# Patient Record
Sex: Male | Born: 1985 | Race: White | Hispanic: No | Marital: Single | Smoking: Never smoker
Health system: Southern US, Community
[De-identification: ages and names within clinical notes are randomized; demographics above are authoritative.]

## PROBLEM LIST (undated history)

## (undated) DIAGNOSIS — F419 Anxiety disorder, unspecified: Secondary | ICD-10-CM

## (undated) DIAGNOSIS — I10 Essential (primary) hypertension: Secondary | ICD-10-CM

---
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
## 2007-09-22 ENCOUNTER — Encounter: Admission: RE | Admit: 2007-09-22 | Discharge: 2007-09-22 | Payer: Self-pay | Admitting: Family Medicine

## 2008-06-10 IMAGING — CT CT PELVIS W/ CM
2 of 5 series · 17 of 46 positions shown, 19 images · IV contrast (RADICAT/WATER & [ID] OMNI 300)
Comparison: None.

CLINICAL DATA: Right lower quadrant pain. 
ABDOMEN CT WITH CONTRAST:
TECHNIQUE: Multidetector CT imaging of the abdomen was performed following the standard protocol during bolus administration of intravenous contrast.
Contrast:  355cc Omnipaque 300
TECHNIQUE: Multidetector CT imaging of the pelvis was performed following the standard protocol during bolus administration of intravenous contrast.

[Series 2: abdomen w/ · axial · 0.70mm/px · z∈[-486,-61]mm · 14 of 95 slices shown, 16 images]
[im 5/95  soft-tissue]
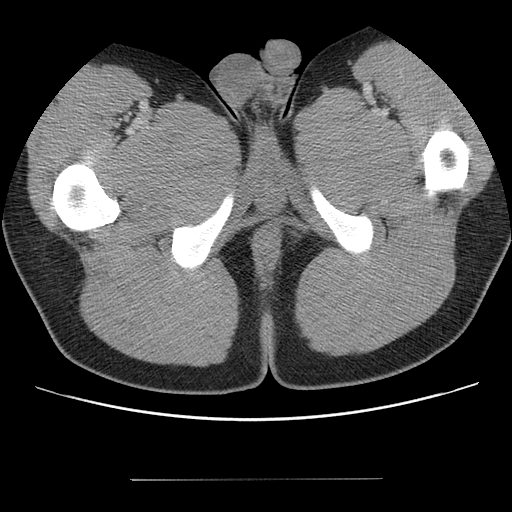
[im 5/95  bone]
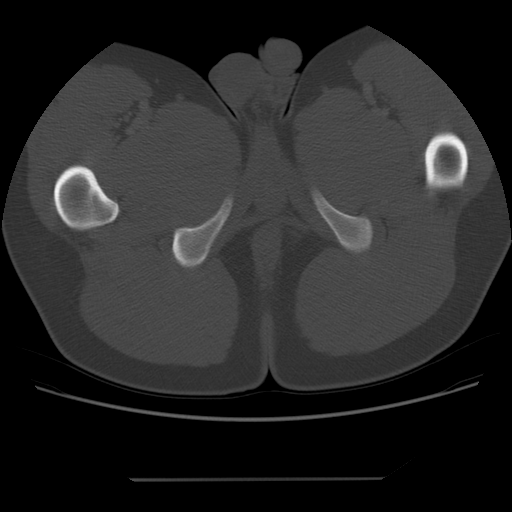
[im 10/95  soft-tissue]
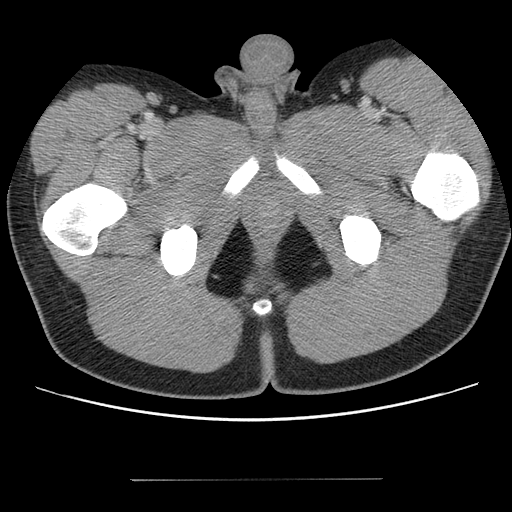
[im 20/95  soft-tissue]
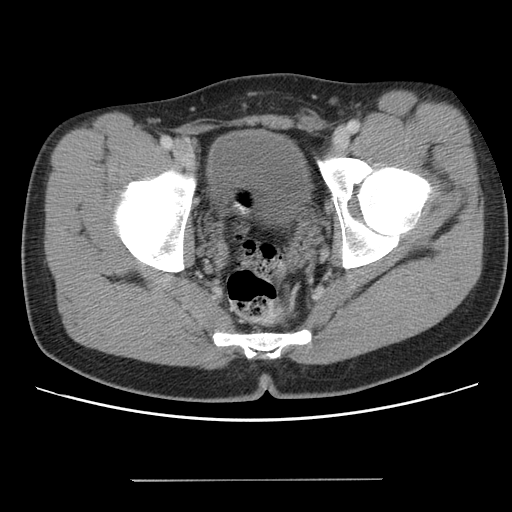
[im 25/95  soft-tissue]
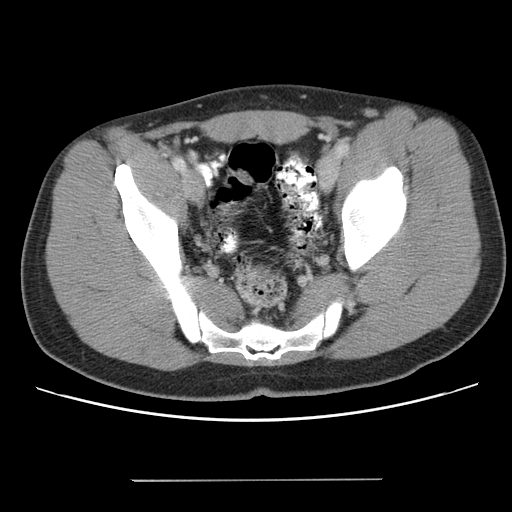
[im 30/95  soft-tissue]
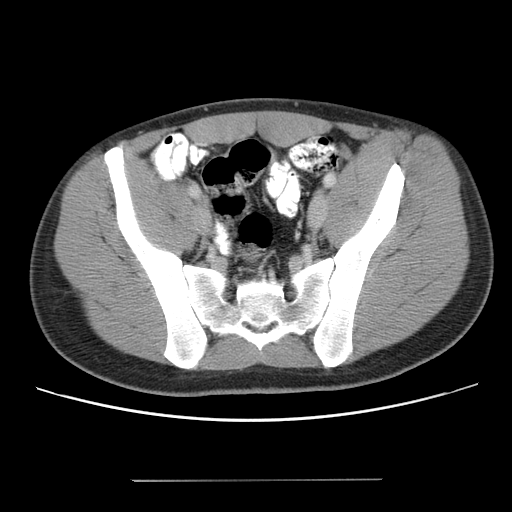
[im 40/95  soft-tissue]
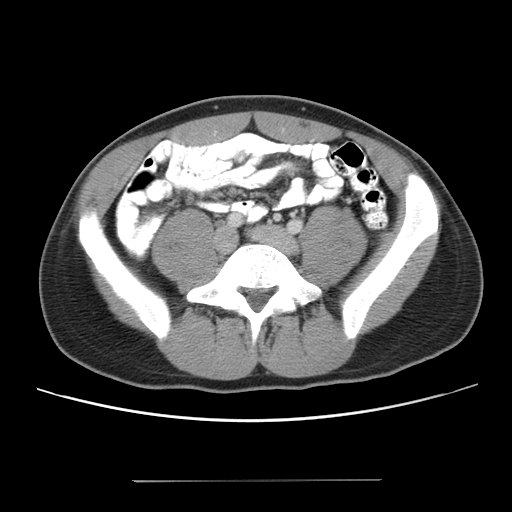
[im 45/95  soft-tissue]
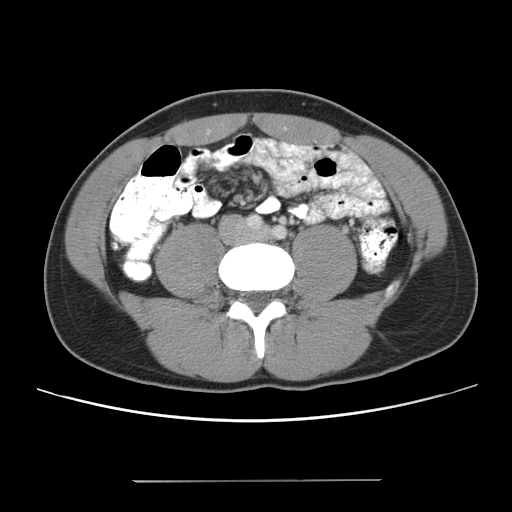
[im 50/95  soft-tissue]
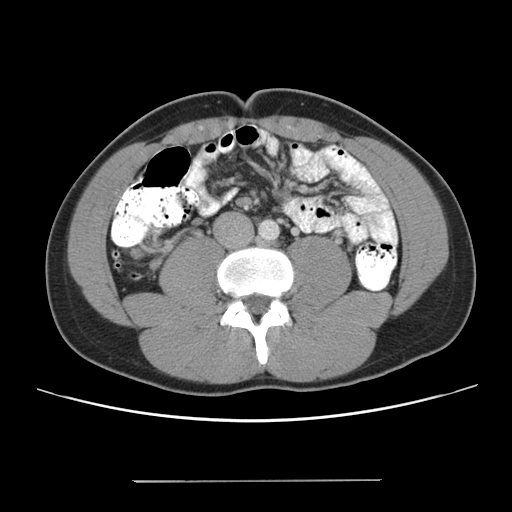
[im 55/95  soft-tissue]
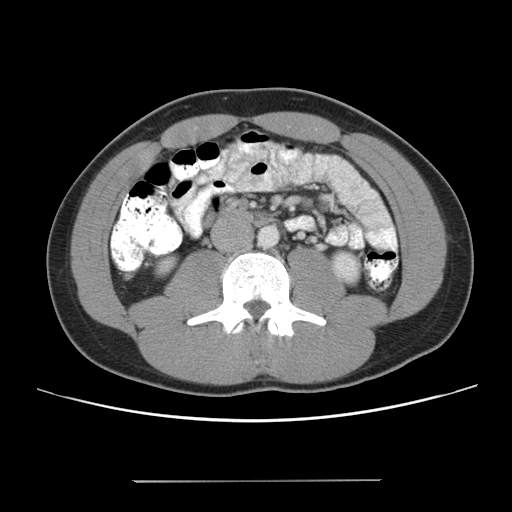
[im 55/95  bone]
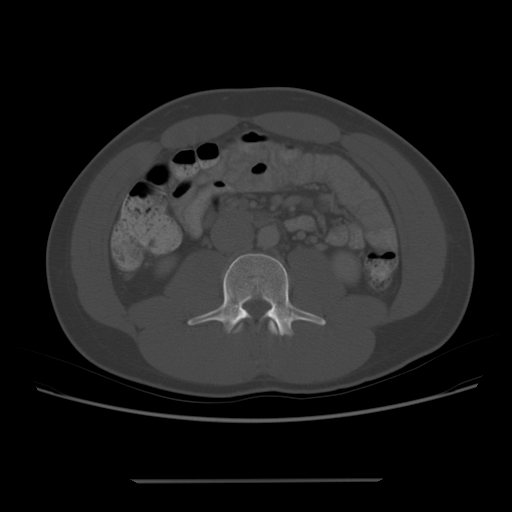
[im 65/95  soft-tissue]
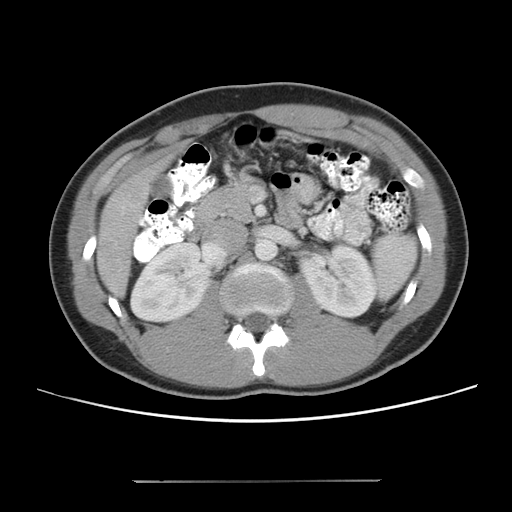
[im 70/95  soft-tissue]
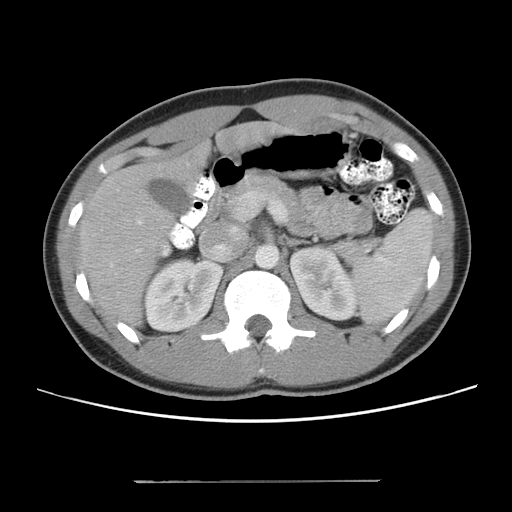
[im 75/95  soft-tissue]
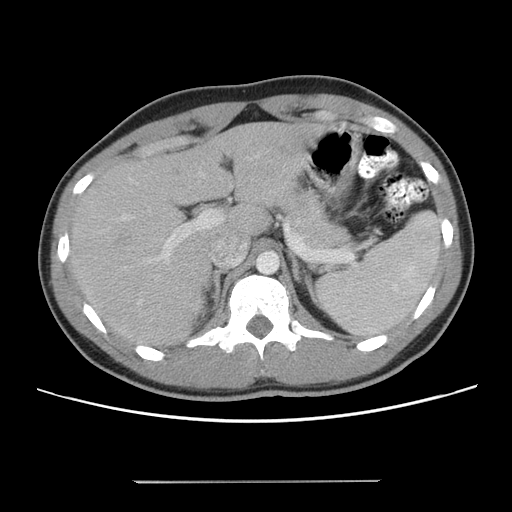
[im 85/95  soft-tissue]
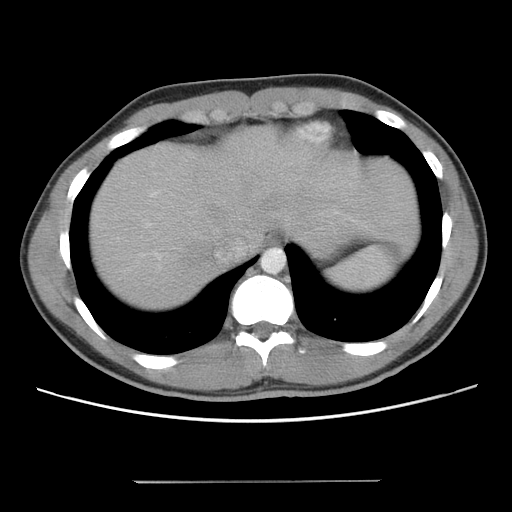
[im 90/95  soft-tissue]
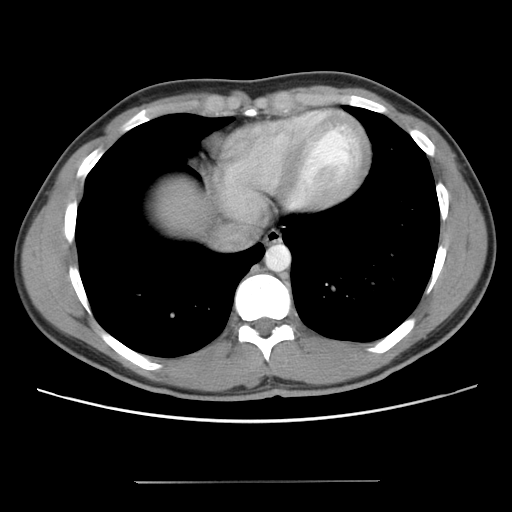

[Series 400: cor · coronal · 0.86mm/px · 3 of 97 slices shown]
[im 33/97  soft-tissue]
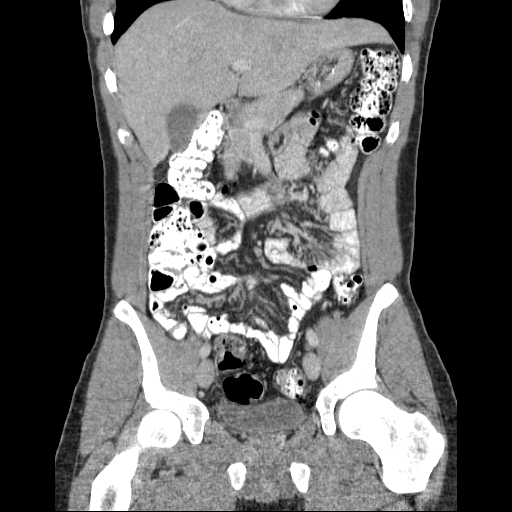
[im 43/97  soft-tissue]
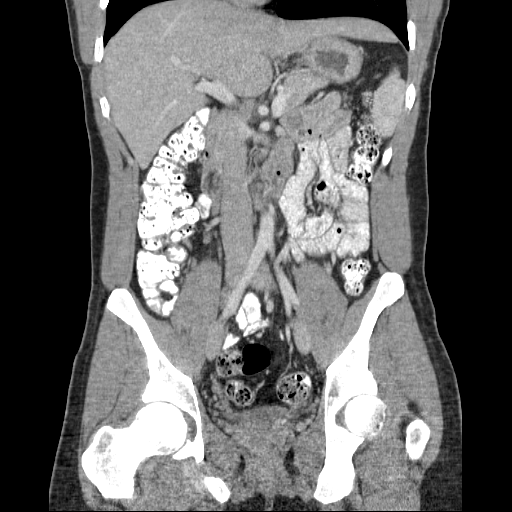
[im 54/97  soft-tissue]
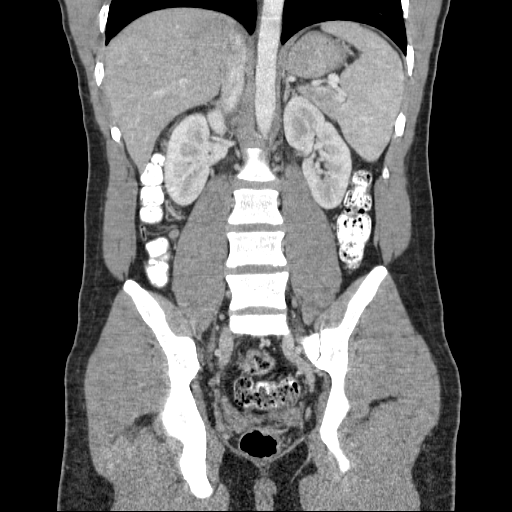

[17 of 46 positions shown; findings below may reference images not displayed]

FINDINGS: The lung bases are clear.  
The liver is normal in attenuation and morphology.  There is a water density mass at the left cardiophrenic angle measuring 15.7 x 51.4 mm, image 9. 
Spleen negative.
Adrenal glands negative.
The pancreas is negative.
The left kidney is negative.
The right kidney is negative.
Negative for enlarged retroperitoneal or small bowel mesenteric lymph nodes.
There is no free fluid, or abnormal fluid collections.
The bowel loops are nondilated.  The appendix is normal.
IMPRESSION: 1.  No acute upper abdominal CT findings. 
2.  Normal appendix. 
PELVIS CT WITH CONTRAST:
FINDINGS: The urinary bladder is normal. 
There is no free fluid or abnormal fluid collections. 
The pelvic bowel loops are unremarkable.  
There is no mass.  
No enlarged pelvic or inguinal lymph nodes.
IMPRESSION: No acute pelvic CT findings.

## 2011-05-25 ENCOUNTER — Emergency Department (HOSPITAL_COMMUNITY)
Admission: EM | Admit: 2011-05-25 | Discharge: 2011-05-26 | Disposition: A | Discharge status: Other Acute Inpatient Hospital | Payer: 59 | Attending: Emergency Medicine | Admitting: Emergency Medicine

## 2011-05-25 DIAGNOSIS — F4310 Post-traumatic stress disorder, unspecified: Secondary | ICD-10-CM | POA: Insufficient documentation

## 2011-05-25 DIAGNOSIS — F29 Unspecified psychosis not due to a substance or known physiological condition: Secondary | ICD-10-CM | POA: Insufficient documentation

## 2011-05-25 DIAGNOSIS — F988 Other specified behavioral and emotional disorders with onset usually occurring in childhood and adolescence: Secondary | ICD-10-CM | POA: Insufficient documentation

## 2011-05-25 LAB — CBC
Hemoglobin: 15.5 g/dL (ref 13.0–17.0)
MCH: 31.7 pg (ref 26.0–34.0)
Platelets: 206 10*3/uL (ref 150–400)
RBC: 4.89 MIL/uL (ref 4.22–5.81)

## 2011-05-25 LAB — DIFFERENTIAL
Basophils Absolute: 0 10*3/uL (ref 0.0–0.1)
Basophils Relative: 0 % (ref 0–1)
Eosinophils Absolute: 0 10*3/uL (ref 0.0–0.7)
Monocytes Relative: 4 % (ref 3–12)
Neutro Abs: 7.5 10*3/uL (ref 1.7–7.7)
Neutrophils Relative %: 84 % — ABNORMAL HIGH (ref 43–77)

## 2011-05-26 ENCOUNTER — Emergency Department (HOSPITAL_COMMUNITY): Payer: 59

## 2011-05-26 ENCOUNTER — Encounter (HOSPITAL_COMMUNITY): Payer: Self-pay

## 2011-05-26 ENCOUNTER — Inpatient Hospital Stay (HOSPITAL_COMMUNITY)
Admission: AD | Admit: 2011-05-26 | Discharge: 2011-06-03 | DRG: 897 | Disposition: A | Discharge status: Home / Self Care | Payer: 59 | Source: Ambulatory Visit | Attending: Psychiatry | Admitting: Psychiatry

## 2011-05-26 DIAGNOSIS — F29 Unspecified psychosis not due to a substance or known physiological condition: Secondary | ICD-10-CM

## 2011-05-26 DIAGNOSIS — F1920 Other psychoactive substance dependence, uncomplicated: Principal | ICD-10-CM

## 2011-05-26 DIAGNOSIS — Z79899 Other long term (current) drug therapy: Secondary | ICD-10-CM

## 2011-05-26 DIAGNOSIS — Z560 Unemployment, unspecified: Secondary | ICD-10-CM

## 2011-05-26 DIAGNOSIS — F1010 Alcohol abuse, uncomplicated: Secondary | ICD-10-CM

## 2011-05-26 LAB — RAPID URINE DRUG SCREEN, HOSP PERFORMED
Barbiturates: NOT DETECTED
Benzodiazepines: NOT DETECTED
Cocaine: NOT DETECTED
Tetrahydrocannabinol: NOT DETECTED

## 2011-05-26 LAB — COMPREHENSIVE METABOLIC PANEL
ALT: 14 U/L (ref 0–53)
AST: 19 U/L (ref 0–37)
Alkaline Phosphatase: 53 U/L (ref 39–117)
CO2: 25 mEq/L (ref 19–32)
Calcium: 9.8 mg/dL (ref 8.4–10.5)
GFR calc non Af Amer: >90 mL/min (ref >90)
Glucose, Bld: 117 mg/dL — ABNORMAL HIGH (ref 70–99)
Potassium: 3.7 mEq/L (ref 3.5–5.1)
Sodium: 138 mEq/L (ref 135–145)
Total Protein: 7.3 g/dL (ref 6.0–8.3)

## 2011-05-28 NOTE — Assessment & Plan Note (Signed)
NAMEMARLOWE, LAWES NO.:  [id number]  MEDICAL RECORD NO.:  [id number]  LOCATION:  0403                          FACILITY:  [facility]  PHYSICIAN:  Eulogio Ditch, MD DATE OF BIRTH:  11/11/1985  DATE OF ADMISSION:  05/26/2011 DATE OF DISCHARGE:                      PSYCHIATRIC ADMISSION ASSESSMENT   IDENTIFYING INFORMATION:  A 26 year old male that was admitted on May 26, 2011.  REASON FOR ADMISSION:  The patient presented to the emergency department with complaints of anxiety.  He states that he has been using a lot of Adderall.  He states that he has a history of binging alcohol, seeing visions.  He denies any suicidal or homicidal thoughts.  Denies any auditory hallucinations.  Feeling somewhat confused.  PAST PSYCHIATRIC HISTORY:  The patient reports being hospitalized at [facility] in the past for depression and alcohol.  SOCIAL HISTORY:  The patient is single.  He lives alone, is unemployed.  FAMILY HISTORY:  None.  ALCOHOL AND DRUG HISTORY:  Again, the patient reports overusing his Adderall and has a past history of alcohol use.  PRIMARY CARE PROVIDER:  None.  MEDICAL PROBLEMS:  The patient denies any acute or chronic health issues.  MEDICATIONS:  Again, list Adderall at 20 mg b.i.d.  DRUG ALLERGIES:  No known allergies.  PHYSICAL EXAM:  This is a well-nourished, well-developed young male assessed in the emergency department.  Physical exam was reviewed.  No significant findings.  He appears in no distress.  He offers no physical complaints.  LABORATORY DATA:  Shows a CAT scan of his head that was unremarkable. Urine drug screen is negative.  Alcohol level less than 11.  Glucose at 117.  CBC is within normal limits.  MENTAL STATUS EXAM:  The patient was seen with Dr. Rogers Blocker.  The patient was in group.  When the patient came out he seemed very guarded and states that he did not want to talk.  He felt that he was singled out of the  group.  He again presented somewhat guarded but then was willing to answer questions.  He had little eye contact again and was dressed in hospital scrubs.  His speech is clear.  His thought processes are somewhat scattered.  He reports feeling that he is having "epiphanies".  He is aware of himself and place.  He does seem to get confused about date.  DIAGNOSES:  AXIS I:  Polysubstance dependence.  Psychosis not otherwise specified. AXIS II:  Deferred. AXIS III:  No known medical conditions. AXIS IV:  Deferred at this time. AXIS V:  Current is 35.  PLAN:  To initiate Risperdal.  Will address his substance use.  We briefly discussed rehab.  The patient is unclear as to what he wants to pursue after hospitalization.  Will continue with the trazodone as the patient states he slept well.  His tentative length of stay at this time is 4-6 days.     Landry Corporal, N.P.   ______________________________ Eulogio Ditch, MD    JO/MEDQ  D:  05/27/2011  T:  05/27/2011  Job:  [id number]  Electronically Signed by Limmie PatriciaP. on 05/28/2011 10:32:06 AM Electronically Signed by Eulogio Ditch  on 05/28/2011 02:31:56 PM

## 2011-06-19 NOTE — Discharge Summary (Cosign Needed)
NAMEMUKUND, WEINREB NO.:  [id number]  MEDICAL RECORD NO.:  [id number]  LOCATION:  0403                          FACILITY:  [facility]  PHYSICIAN:  Eulogio Ditch, MD DATE OF BIRTH:  1985/08/15  DATE OF ADMISSION:  05/26/2011 DATE OF DISCHARGE:  06/03/2011                              DISCHARGE SUMMARY   IDENTIFYING INFORMATION:  This is a 25 year old male.  This is a voluntary admission.  HISTORY OF PRESENT ILLNESS:  Derrick Blake was admitted by way of our emergency room with complaints of anxiety.  He also admitted that he had been bingeing on alcohol.  He was referred for altered mental status.  On initial presentation, he appeared guarded with little eye contact and finally verbally expressed that he did not want to talk.  Thought processes were rather distracted, scattered.  He made a statement that he was having "epiphanies".  He was oriented to person and place, and was referred for ongoing stabilization.  He had a past history of being hospitalized for substance abuse and depression.  Currently lives alone. He also admitted to over using the Adderall that is prescribed for attention deficit disorder.  MEDICAL EVALUATION:  He was medically evaluated in our emergency room and received additional physical and medical evaluation here on the unit which was found to be consistent with the emergency room.  He offered no physical complaints.  PAST MEDICAL HISTORY:  Attention deficit and hyperactivity disorder.  LAB STUDIES:  Done in the emergency room included a CT scan of his brain that was negative for any acute findings.  Urine drug screen was negative for all substances.  Alcohol screen less than 11.  Random glucose 117, and a normal CBC.  COURSE OF HOSPITALIZATION:  He was admitted to our dual diagnosis unit and given a provisional diagnosis of polysubstance dependence and psychosis NOS.  He was initially started on Ativan 1 mg q.6 hours for anxiety,  Risperdal 0.5 mg M-Tab q.6 hours p.r.n. for psychosis or agitation, Benadryl 50 mg q.6 hours p.o. or IM p.r.n. for any signs of EPS, and trazodone 100 mg p.o. q.h.s. We also started him on a standing scheduled dose of Risperdal 0.5 mg q.a.m. and q.h.s.  Derrick Blake reported that he had been previously in [facility] in [place] in 2010 for not taking his Adderall., and admitted that he had a history of previous mental illness.  By the 24th he was slightly less paranoid and a little bit less disorganized, and we titrated his Risperdal to 1 mg p.o. q.h.s.  He was given Haldol 5 mg emergently on the evening of the 23rd for agitation.  He gave [place] permission to speak with his mother who contributed to his history.  She reported a history of unusual behaviors including preoccupations with the book of Revelations and attempting to interpret signs and symbols.  He had previously been discharged from the [facility] with an honorable discharge, but had a lot of difficulty being there.  Having panic attacks and would go 2 to 3 nights without sleeping.  While also in the [facility] he began having auditory hallucinations, and sensations that his whole body was  on fire.  It was at this time that he had been sent to [facility] in [place].  At that time, he was placed on unknown medications which he took for about 3 months and then subsequently stopped them.  His mother expressed her support and interest in having him move in with them for safety.  By the 26th he was more calm, articulate, cooperative, making statements that he believed that the antichrist was coming on October 31st, which also happened to be his birthday.  Clonazepam 0.5 mg b.i.d. was ordered to address his anxiety.  By the 29th he was complaining of some a.m. sedation and is trazodone was discontinued.  He continued to tolerate the Risperdal well without signs of EPS.  AIMS score was 0.  By the 30th he was  stable, calm, cooperative and ready for discharge.  DISCHARGE PLAN:  Follow up with Dr. Aquilla Solian [place] on [place] 30th at 12 noon, and Dr. Bayard Males, his psychiatrist, in [place], also on October 31st.  DISCHARGE DIAGNOSES:  AXIS I:  Psychosis, not otherwise specified. AXIS II:  Deferred. AXIS III:  No diagnosis. AXIS IV:  Deferred. AXIS V:  Current 55, past year 65 estimated.  DISCHARGE MEDICATIONS: 1. Benadryl 50 mg at q.h.s. 2. Risperdal 0.5 mg q.a.m. 3. Risperdal 2 mg p.o. q.h.s.     Derrick Blake, N.P.   ______________________________ Eulogio Ditch, MD    MAS/MEDQ  D:  06/19/2011  T:  06/19/2011  Job:  [id number]

## 2012-02-12 IMAGING — CT CT HEAD W/O CM
2 series · 15 of 30 positions shown, 19 images · non-contrast
Comparison: None.

CLINICAL DATA: Anxiety and psychosis; insomnia.  Medical clearance.

CT HEAD WITHOUT CONTRAST
TECHNIQUE: Contiguous axial images were obtained from the base of
the skull through the vertex without contrast.

[Series 2: head w/o · axial · non-contrast · 0.44mm/px · z∈[-131,-6]mm · 13 of 31 slices shown, 17 images]
[im 3/31  brain]
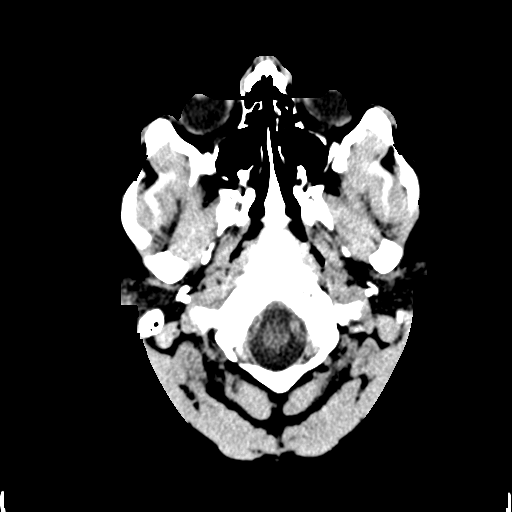
[im 3/31  bone]
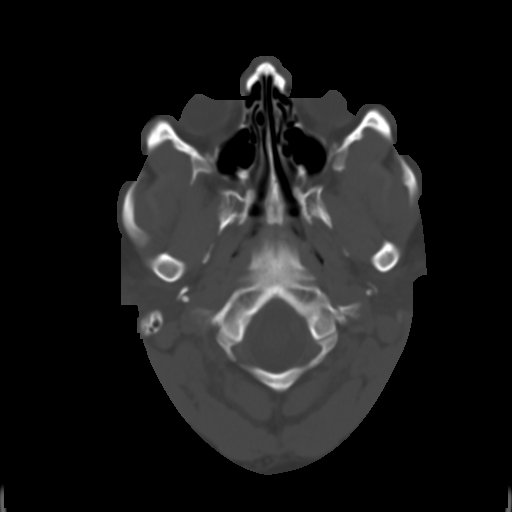
[im 5/31  brain]
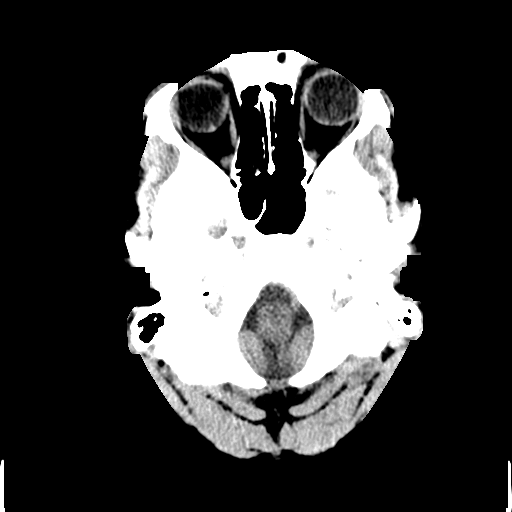
[im 7/31  brain]
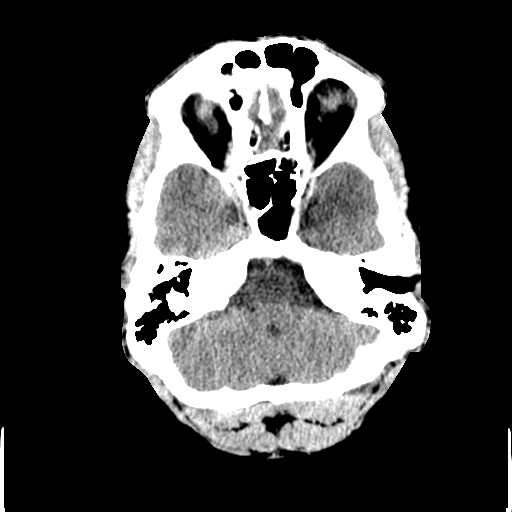
[im 9/31  brain]
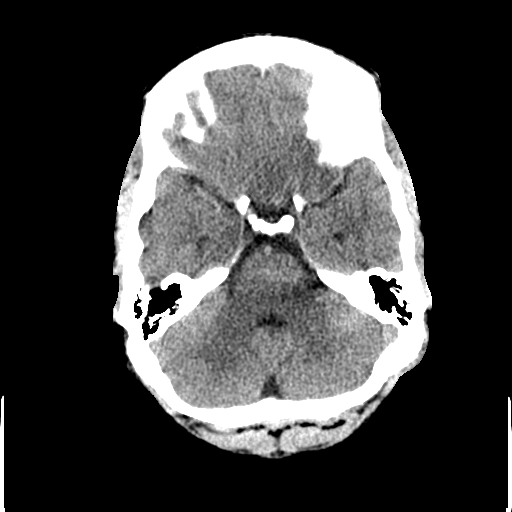
[im 11/31  brain]
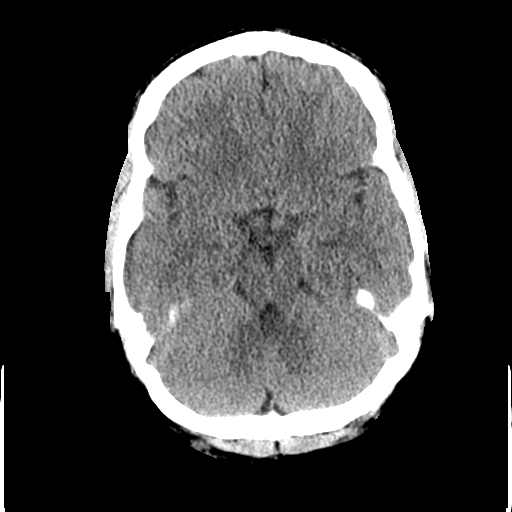
[im 11/31  bone]
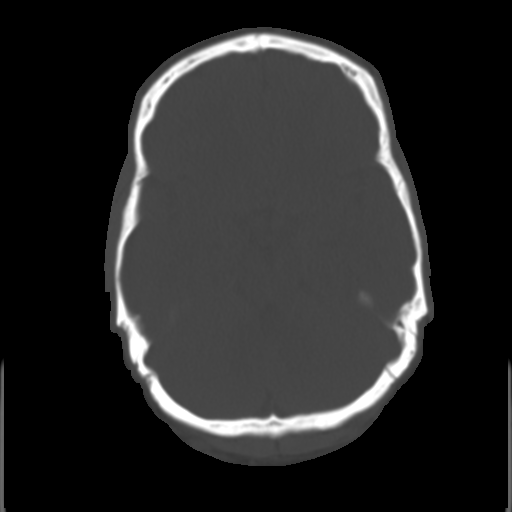
[im 13/31  brain]
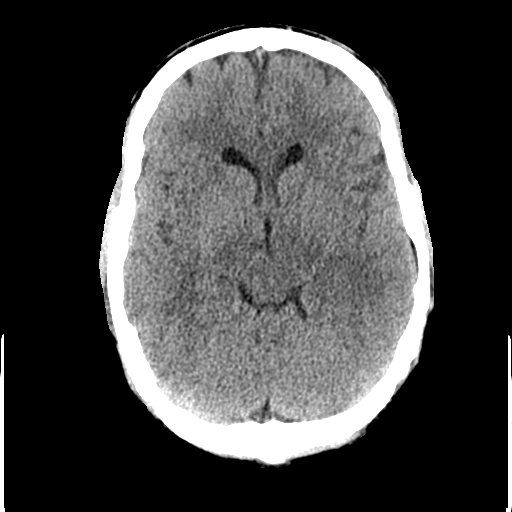
[im 16/31  brain]
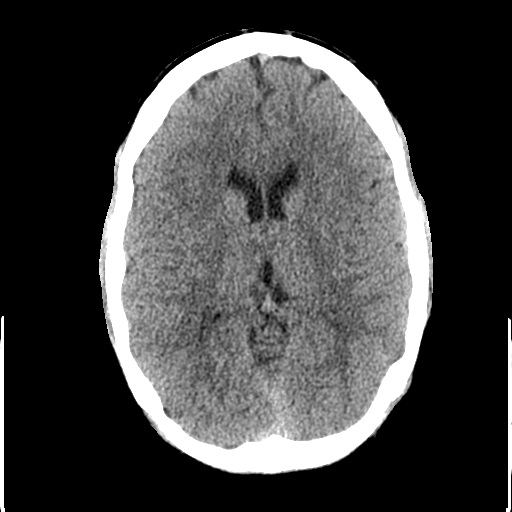
[im 18/31  brain]
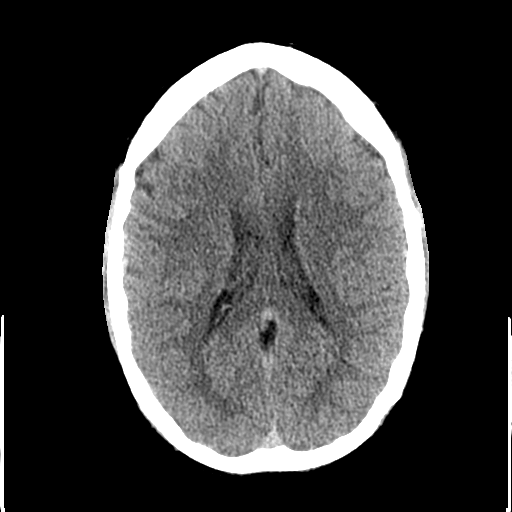
[im 20/31  brain]
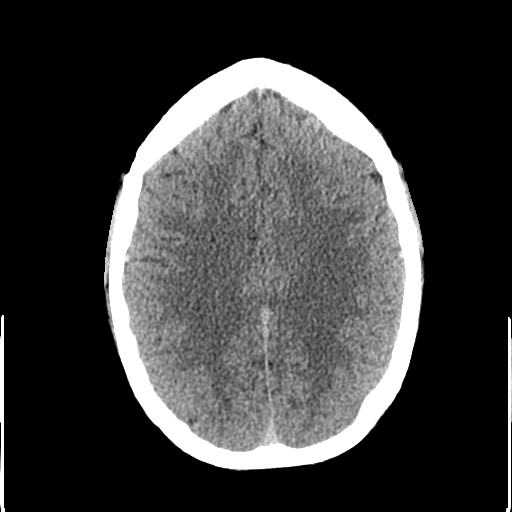
[im 20/31  bone]
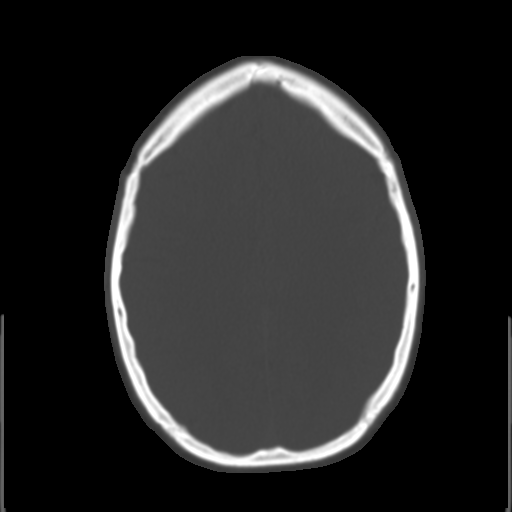
[im 22/31  brain]
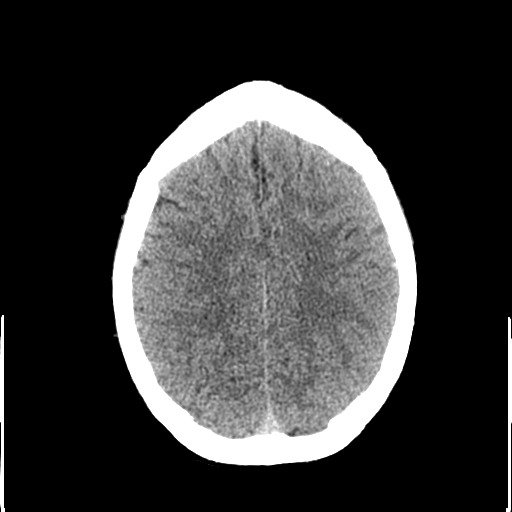
[im 24/31  brain]
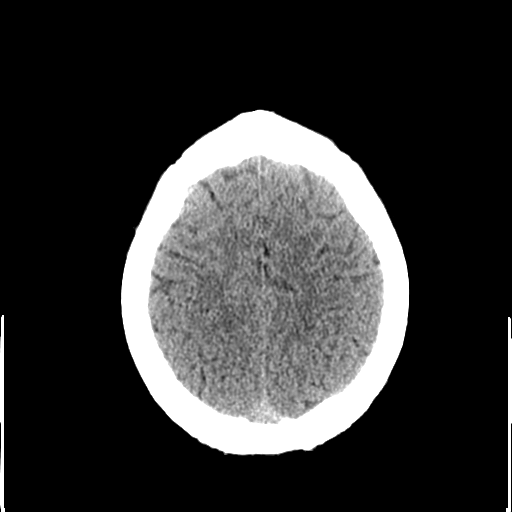
[im 26/31  brain]
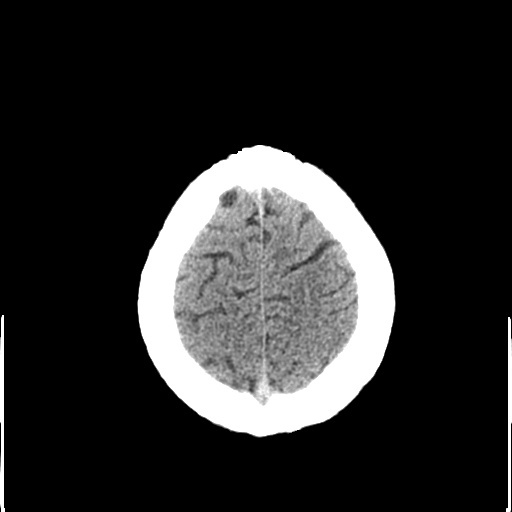
[im 28/31  brain]
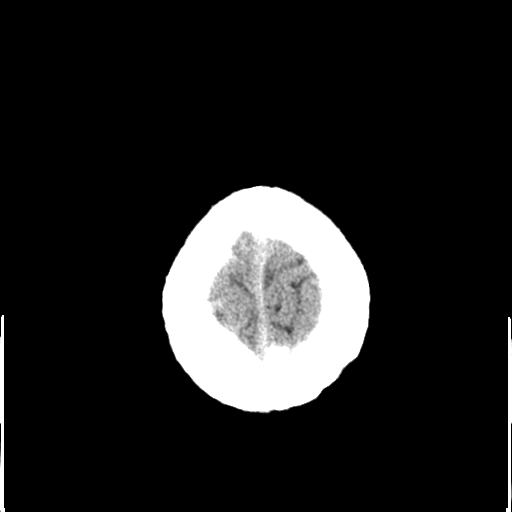
[im 28/31  bone]
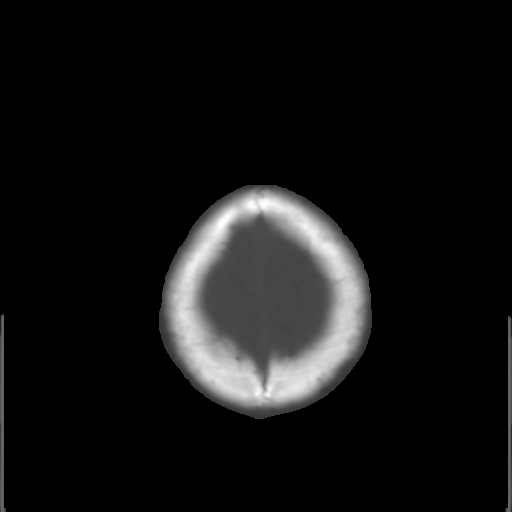

[Series 3: bone windows · axial · 0.44mm/px · z∈[-131,-111]mm · 2 of 31 slices shown]
[im 3/31  bone]
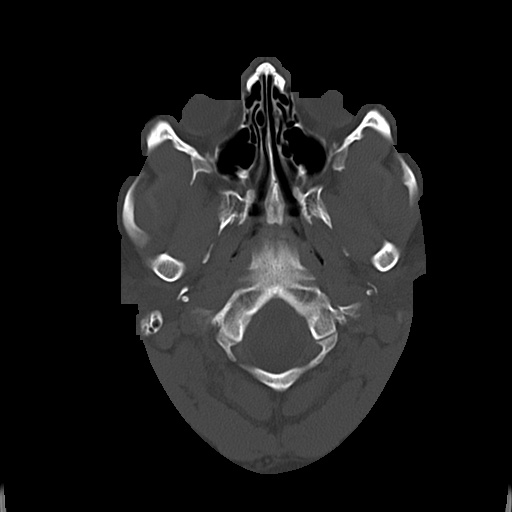
[im 7/31  bone]
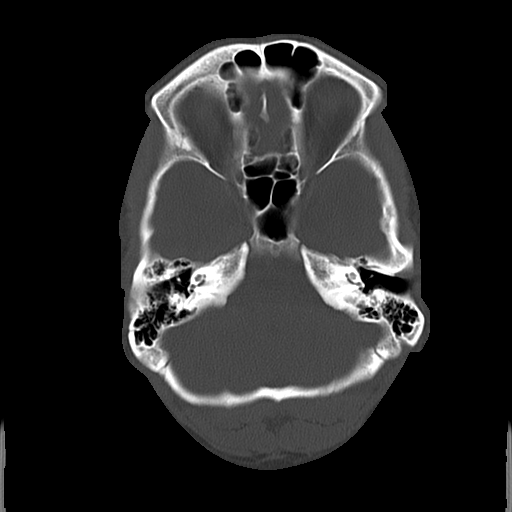

[15 of 30 positions shown; findings below may reference images not displayed]

FINDINGS: There is no evidence of acute infarction, mass lesion, or
intra- or extra-axial hemorrhage on CT.

The posterior fossa, including the cerebellum, brainstem and fourth
ventricle, is within normal limits.  The third and lateral
ventricles, and basal ganglia are unremarkable in appearance.  The
cerebral hemispheres are symmetric in appearance, with normal gray-
white differentiation.  No mass effect or midline shift is seen.

There is no evidence of fracture; visualized osseous structures are
unremarkable in appearance.  There is incomplete fusion of the
frontal suture.  The visualized portions of the orbits are within
normal limits.  The paranasal sinuses and mastoid air cells are
well-aerated.  No significant soft tissue abnormalities are seen.
IMPRESSION: Unremarkable noncontrast CT of the head.

## 2013-11-09 ENCOUNTER — Encounter (HOSPITAL_COMMUNITY): Payer: Self-pay | Admitting: Emergency Medicine

## 2013-11-09 ENCOUNTER — Emergency Department (HOSPITAL_COMMUNITY)
Admission: EM | Admit: 2013-11-09 | Discharge: 2013-11-10 | Disposition: A | Discharge status: Home / Self Care | Payer: BC Managed Care – PPO | Attending: Emergency Medicine | Admitting: Emergency Medicine

## 2013-11-09 DIAGNOSIS — IMO0002 Reserved for concepts with insufficient information to code with codable children: Secondary | ICD-10-CM | POA: Insufficient documentation

## 2013-11-09 DIAGNOSIS — I10 Essential (primary) hypertension: Secondary | ICD-10-CM | POA: Insufficient documentation

## 2013-11-09 DIAGNOSIS — F29 Unspecified psychosis not due to a substance or known physiological condition: Secondary | ICD-10-CM | POA: Insufficient documentation

## 2013-11-09 DIAGNOSIS — Z79899 Other long term (current) drug therapy: Secondary | ICD-10-CM | POA: Insufficient documentation

## 2013-11-09 DIAGNOSIS — F988 Other specified behavioral and emotional disorders with onset usually occurring in childhood and adolescence: Secondary | ICD-10-CM

## 2013-11-09 DIAGNOSIS — R197 Diarrhea, unspecified: Secondary | ICD-10-CM | POA: Insufficient documentation

## 2013-11-09 DIAGNOSIS — F419 Anxiety disorder, unspecified: Secondary | ICD-10-CM

## 2013-11-09 HISTORY — DX: Anxiety disorder, unspecified: F41.9

## 2013-11-09 HISTORY — DX: Essential (primary) hypertension: I10

## 2013-11-09 LAB — CBC WITH DIFFERENTIAL/PLATELET
Basophils Absolute: 0 10*3/uL (ref 0.0–0.1)
Basophils Relative: 0 % (ref 0–1)
EOS ABS: 0 10*3/uL (ref 0.0–0.7)
EOS PCT: 1 % (ref 0–5)
HEMATOCRIT: 43.6 % (ref 39.0–52.0)
HEMOGLOBIN: 15.4 g/dL (ref 13.0–17.0)
LYMPHS ABS: 1.7 10*3/uL (ref 0.7–4.0)
Lymphocytes Relative: 26 % (ref 12–46)
MCH: 31 pg (ref 26.0–34.0)
MCHC: 35.3 g/dL (ref 30.0–36.0)
MCV: 87.9 fL (ref 78.0–100.0)
MONO ABS: 0.4 10*3/uL (ref 0.1–1.0)
MONOS PCT: 6 % (ref 3–12)
Neutro Abs: 4.4 10*3/uL (ref 1.7–7.7)
Neutrophils Relative %: 67 % (ref 43–77)
PLATELETS: 185 10*3/uL (ref 150–400)
RBC: 4.96 MIL/uL (ref 4.22–5.81)
RDW: 12.1 % (ref 11.5–15.5)
WBC: 6.6 10*3/uL (ref 4.0–10.5)

## 2013-11-09 LAB — COMPREHENSIVE METABOLIC PANEL
ALT: 20 U/L (ref 0–53)
AST: 18 U/L (ref 0–37)
Albumin: 4.6 g/dL (ref 3.5–5.2)
Alkaline Phosphatase: 50 U/L (ref 39–117)
BUN: 17 mg/dL (ref 6–23)
CALCIUM: 9.8 mg/dL (ref 8.4–10.5)
CO2: 27 mEq/L (ref 19–32)
Chloride: 102 mEq/L (ref 96–112)
Creatinine, Ser: 0.93 mg/dL (ref 0.50–1.35)
GLUCOSE: 97 mg/dL (ref 70–99)
Potassium: 3.6 mEq/L — ABNORMAL LOW (ref 3.7–5.3)
Sodium: 141 mEq/L (ref 137–147)
TOTAL PROTEIN: 7.3 g/dL (ref 6.0–8.3)
Total Bilirubin: 0.6 mg/dL (ref 0.3–1.2)

## 2013-11-09 LAB — ETHANOL

## 2013-11-09 LAB — RAPID URINE DRUG SCREEN, HOSP PERFORMED
Amphetamines: NOT DETECTED
BENZODIAZEPINES: NOT DETECTED
Barbiturates: NOT DETECTED
COCAINE: NOT DETECTED
Opiates: NOT DETECTED
Tetrahydrocannabinol: NOT DETECTED

## 2013-11-09 LAB — URINALYSIS, ROUTINE W REFLEX MICROSCOPIC
Bilirubin Urine: NEGATIVE
Glucose, UA: NEGATIVE mg/dL
Hgb urine dipstick: NEGATIVE
KETONES UR: 15 mg/dL — AB
LEUKOCYTES UA: NEGATIVE
NITRITE: NEGATIVE
PH: 6.5 (ref 5.0–8.0)
Protein, ur: NEGATIVE mg/dL
SPECIFIC GRAVITY, URINE: 1.025 (ref 1.005–1.030)
UROBILINOGEN UA: 0.2 mg/dL (ref 0.0–1.0)

## 2013-11-09 MED ORDER — ZOLPIDEM TARTRATE 5 MG PO TABS: 5.0000 mg | ORAL_TABLET | Freq: Every evening | ORAL | Status: DC | PRN

## 2013-11-09 MED ORDER — ACETAMINOPHEN 325 MG PO TABS
650.0000 mg | ORAL_TABLET | ORAL | Status: DC | PRN
Start: 2013-11-09 — End: 2013-11-10

## 2013-11-09 MED ORDER — NICOTINE 21 MG/24HR TD PT24: 21.0000 mg | MEDICATED_PATCH | Freq: Every day | TRANSDERMAL | Status: DC

## 2013-11-09 MED ORDER — IBUPROFEN 200 MG PO TABS
600.0000 mg | ORAL_TABLET | Freq: Three times a day (TID) | ORAL | Status: DC | PRN
  Administered 2013-11-09: 600 mg via ORAL
  Filled 2013-11-09: qty 3

## 2013-11-09 MED ORDER — ONDANSETRON HCL 4 MG PO TABS: 4.0000 mg | ORAL_TABLET | Freq: Three times a day (TID) | ORAL | Status: DC | PRN

## 2013-11-09 MED ORDER — ALUM & MAG HYDROXIDE-SIMETH 200-200-20 MG/5ML PO SUSP: 30.0000 mL | ORAL | Status: DC | PRN

## 2013-11-09 MED ORDER — LORAZEPAM 1 MG PO TABS
1.0000 mg | ORAL_TABLET | Freq: Three times a day (TID) | ORAL | Status: DC | PRN
  Administered 2013-11-09: 1 mg via ORAL
  Filled 2013-11-09 (×2): qty 1

## 2013-11-09 NOTE — ED Provider Notes (Signed)
CSN: [id number]     Arrival date & time 11/09/13  1733 History   First MD Initiated Contact with Patient 11/09/13 2139     Chief Complaint  Patient presents with  . Anxiety     (Consider location/radiation/quality/duration/timing/severity/associated sxs/prior Treatment) HPI Comments: Patient is a 28 year old male with history of anxiety and hypertension who presents to the emergency department for evaluation of worsening anxiety. He is accompanied by his mother. 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 He reports that he had to unload furniture in the middle of the street in [place] and [place]. This week long trip was very overwhelming for him. He was told by a fellow driver that he had an indentation in his head. Since that time he has been preoccupied that this is "the mark of the beast". His mother reports that he has been having worsening religious delusions and applying scripture from the Bible to his life in illogical ways. He describes how he feels a loop where he thinks "I'm crazy, I'm not crazy". He has not been taking any of his psychiatric medications in the past 2 years. He has tried herbal teas and supplements. He denies any suicidal or homicidal ideations. He denies illegal drug use. He denies alcohol use. He has required prior hospitalization in the past and his mother feels as though he is acting the same way as he did when he was hospitalized in 2012. He complaints of mild diarrhea.   The history is provided by the patient. No language interpreter was used.    Past Medical History  Diagnosis Date  . Anxiety   . Hypertension    History reviewed. No pertinent past surgical history. History reviewed. No pertinent family history. History  Substance Use Topics  . Smoking status: Never Smoker   . Smokeless tobacco: Not on file  . Alcohol Use: Yes    Review of Systems   Constitutional: Negative for fever and chills.  Respiratory: Negative for shortness of breath.   Cardiovascular: Negative for chest pain.  Gastrointestinal: Positive for diarrhea. Negative for vomiting and abdominal pain.  Psychiatric/Behavioral: Negative for suicidal ideas. The patient is nervous/anxious.   All other systems reviewed and are negative.     Allergies  Review of patient's allergies indicates no known allergies.  Home Medications   Current Outpatient Rx  Name  Route  Sig  Dispense  Refill  . Melatonin-Theanine (MELATONIN FORTE/L-THEANINE) 3-40 MG TABS   Oral   Take 1 tablet by mouth daily.          BP 121/91  Pulse 71  Temp(Src) 98.7 F (37.1 C) (Oral)  Resp 18  SpO2 98% Physical Exam  Nursing note and vitals reviewed. Constitutional: He is oriented to person, place, and time. He appears well-developed and well-nourished. No distress.  HENT:  Head: Normocephalic and atraumatic.  Right Ear: External ear normal.  Left Ear: External ear normal.  Nose: Nose normal.  Eyes: Conjunctivae are normal.  Neck: Normal range of motion. No tracheal deviation present.  Cardiovascular: Normal rate, regular rhythm and normal heart sounds.   Pulmonary/Chest: Effort normal and breath sounds normal. No stridor.  Abdominal: Soft. He exhibits no distension. There is no tenderness.  Musculoskeletal: Normal range of motion.  Neurological: He is alert and oriented to person, place, and time.  Skin: Skin is warm and dry. He is not diaphoretic.  Psychiatric: His behavior is normal. His mood appears anxious. His speech is rapid and/or pressured. He expresses no homicidal and no suicidal ideation.    ED Course  Procedures (including critical care time) Labs Review Labs Reviewed  COMPREHENSIVE METABOLIC PANEL - Abnormal; Notable for the following:    Potassium 3.6 (*)    All other components within normal limits  CBC WITH DIFFERENTIAL  ETHANOL  URINALYSIS, ROUTINE W REFLEX  MICROSCOPIC  URINE RAPID DRUG SCREEN (HOSP PERFORMED)   Imaging Review No results found.   EKG Interpretation None      MDM   Final diagnoses:  Psychosis    Patient presents to ED for evaluation of increased anxiety and agitation. He appears anxious with pressured speech. Patient needs psychiatric evaluation and is agreeable to this. Patient is medically cleared. Vital signs stable. Patient / Family / Caregiver informed of clinical course, understand medical decision-making process, and agree with plan.    Mora BellmanHannah S Paw Karstens, PA-C 11/09/13 2329

## 2013-11-09 NOTE — ED Provider Notes (Signed)
Medical screening examination/treatment/procedure(s) were performed by non-physician practitioner and as supervising physician I was immediately available for consultation/collaboration.   EKG Interpretation None       Murial Beam R. Emilina Smarr, MD 11/09/13 2345 

## 2013-11-09 NOTE — ED Notes (Signed)
Pt is a Naval architecttruck driver.  Has been starting to have religious delusions.  Was told that he has an indention in his head by a fellow driver.  Since then, has been preocuppied with thinking that this is the mark of the beast.  Pt is excessively anxious, worrying about if his friends will think he is different.  Pt states he hasn't seen his friends in a week.  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

## 2013-11-10 DIAGNOSIS — F29 Unspecified psychosis not due to a substance or known physiological condition: Secondary | ICD-10-CM

## 2013-11-10 DIAGNOSIS — F411 Generalized anxiety disorder: Secondary | ICD-10-CM

## 2013-11-10 MED ORDER — LORAZEPAM 1 MG PO TABS
1.0000 mg | ORAL_TABLET | Freq: Once | ORAL | Status: AC
  Administered 2013-11-10: 1 mg via ORAL

## 2013-11-10 MED ORDER — AMPHETAMINE-DEXTROAMPHET ER 20 MG PO CP24: 20.0000 mg | ORAL_CAPSULE | Freq: Every day | ORAL | Status: AC

## 2013-11-10 MED ORDER — SERTRALINE HCL 50 MG PO TABS: 50.0000 mg | ORAL_TABLET | Freq: Every day | ORAL | Status: AC

## 2013-11-10 NOTE — ED Notes (Signed)
Up to the bathroom 

## 2013-11-10 NOTE — Consult Note (Addendum)
[facility] Face-to-Face Psychiatry Consult   Reason for Consult:  Anxiety that got out of control Referring Physician:  ER MD  Derrick Blake is an 28 y.o. male. Total Time spent with patient: 1 hour  Assessment: AXIS I:  Anxiety Disorder NOS AXIS II:  Deferred AXIS III:   Past Medical History  Diagnosis Date  . Anxiety   . Hypertension    AXIS IV:  chronic anxiety AXIS V:  51-60 moderate symptoms  Plan:  No evidence of imminent risk to self or others at present.    Subjective:   Derrick Blake is a 28 y.o. male patient admitted with anxiety.  HPI:  Mr Holzmann has a history of anxiety.  He worked in a Designer, jewellery and liked the job and was selected to go on a delivery trip up the [employer] and he was overwhelmed with the trip, had panic attacks, had to go to the hospital in [place] and felt guilty for all the trouble and disappointment he caused.  He worries all the time to the point of feeling unreal at times, "paranoid" that people are looking at him or judging him.  He is reluctant to take medication.  He has also been diagnosed with ADHD in the past as well.He denies any  Suicidal ideation and feels better just talking about the possibility that he can be helped. HPI Elements:   Location:  anxiety. Quality:  to the point of panic attacks. Severity:  feeling overwelmed and "paranoid". Timing:  trip to [place] made him feel out of control. Duration:  anxiety for years. 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  Past Psychiatric History: Past Medical History  Diagnosis Date  . Anxiety   . Hypertension     reports that he has never smoked. He does not have any smokeless tobacco history on file. He reports that he drinks alcohol. He reports that he does not use illicit drugs. History reviewed. No pertinent family history. Family History Substance Abuse: No Family Supports: Yes, List: (parents) Living Arrangements: Parent Can pt return to current living  arrangement?: Yes Abuse/Neglect [facility]) Physical Abuse: Denies (Pt denies then proceed to talk of wrestling with a friend as a child who kept him pinned down for a long time.) Verbal Abuse: Denies Sexual Abuse: Denies Allergies:  No Known Allergies  ACT Assessment Complete:  Yes:    Educational Status    Risk to Self: Risk to self Suicidal Ideation: No Suicidal Intent: No Is patient at risk for suicide?: No Suicidal Plan?: No Access to Means: No What has been your use of drugs/alcohol within the last 12 months?:  (alcohol) Previous Attempts/Gestures: No How many times?:  (NA) Other Self Harm Risks:  (No) Triggers for Past Attempts: None known Intentional Self Injurious Behavior: None Family Suicide History: Yes (maternal uncle suicide 8 yrs ago) Recent stressful life event(s): Other (Comment) Persecutory voices/beliefs?:  ("new job very physical and pushes me past my limit") Depression: Yes Depression Symptoms: Tearfulness Substance abuse history and/or treatment for substance abuse?: Yes  Risk to Others: Risk to Others Homicidal Ideation: No Thoughts of Harm to Others: No Current Homicidal Intent: No Current Homicidal Plan: No Access to Homicidal Means: No History of harm to others?: No Assessment of Violence: None Noted Does patient have access to weapons?: No Criminal Charges Pending?: No Does patient have a court date: No  Abuse: Abuse/Neglect Assessment (Assessment to be complete while patient is alone) Physical Abuse: Denies (  Pt denies then proceed to talk of wrestling with a friend as a child who kept him pinned down for a long time.) Verbal Abuse: Denies Sexual Abuse: Denies Exploitation of patient/patient's resources: Denies Self-Neglect: Denies  Prior Inpatient Therapy: Prior Inpatient Therapy Prior Inpatient Therapy: Yes Prior Therapy Dates:  (2012) Prior Therapy Facilty/Provider(s): [facility] Reason for Treatment:  (Anxiety, Drug use)  Prior Outpatient Therapy:  Prior Outpatient Therapy Prior Outpatient Therapy: Yes Prior Therapy Dates:  (2014) Prior Therapy Facilty/Provider(s):  [facility]) Reason for Treatment:  (mood swings, depression, stressed)  Additional Information: Additional Information 1:1 In Past 12 Months?: No CIRT Risk: No Elopement Risk: No Does patient have medical clearance?: Yes                  Objective: Blood pressure 127/86, pulse 113, temperature 98.2 F (36.8 C), temperature source Oral, resp. rate 16, SpO2 100.00%.There is no height or weight on file to calculate BMI. Results for orders placed during the hospital encounter of 11/09/13 (from the past 72 hour(s))  CBC WITH DIFFERENTIAL     Status: None   Collection Time    11/09/13 10:20 PM      Result Value Ref Range   WBC 6.6  4.0 - 10.5 K/uL   RBC 4.96  4.22 - 5.81 MIL/uL   Hemoglobin 15.4  13.0 - 17.0 g/dL   HCT 43.6  39.0 - 52.0 %   MCV 87.9  78.0 - 100.0 fL   MCH 31.0  26.0 - 34.0 pg   MCHC 35.3  30.0 - 36.0 g/dL   RDW 12.1  11.5 - 15.5 %   Platelets 185  150 - 400 K/uL   Neutrophils Relative % 67  43 - 77 %   Neutro Abs 4.4  1.7 - 7.7 K/uL   Lymphocytes Relative 26  12 - 46 %   Lymphs Abs 1.7  0.7 - 4.0 K/uL   Monocytes Relative 6  3 - 12 %   Monocytes Absolute 0.4  0.1 - 1.0 K/uL   Eosinophils Relative 1  0 - 5 %   Eosinophils Absolute 0.0  0.0 - 0.7 K/uL   Basophils Relative 0  0 - 1 %   Basophils Absolute 0.0  0.0 - 0.1 K/uL  COMPREHENSIVE METABOLIC PANEL     Status: Abnormal   Collection Time    11/09/13 10:20 PM      Result Value Ref Range   Sodium 141  137 - 147 mEq/L   Potassium 3.6 (*) 3.7 - 5.3 mEq/L   Chloride 102  96 - 112 mEq/L   CO2 27  19 - 32 mEq/L   Glucose, Bld 97  70 - 99 mg/dL   BUN 17  6 - 23 mg/dL   Creatinine, Ser 0.93  0.50 - 1.35 mg/dL   Calcium 9.8  8.4 - 10.5 mg/dL   Total Protein 7.3  6.0 - 8.3 g/dL   Albumin 4.6  3.5 - 5.2 g/dL   AST 18  0 - 37 U/L   ALT 20  0 - 53 U/L   Alkaline Phosphatase  50  39 - 117 U/L   Total Bilirubin 0.6  0.3 - 1.2 mg/dL   GFR calc non Af Amer >90  >90 mL/min   GFR calc Af Amer >90  >90 mL/min   Comment: (NOTE)     The eGFR has been calculated using the CKD EPI equation.     This calculation has not been validated in  all clinical situations.     eGFR's persistently <90 mL/min signify possible Chronic Kidney     Disease.  ETHANOL     Status: None   Collection Time    11/09/13 10:20 PM      Result Value Ref Range   Alcohol, Ethyl (B) <11  0 - 11 mg/dL   Comment:            LOWEST DETECTABLE LIMIT FOR     SERUM ALCOHOL IS 11 mg/dL     FOR MEDICAL PURPOSES ONLY  URINALYSIS, ROUTINE W REFLEX MICROSCOPIC     Status: Abnormal   Collection Time    11/09/13 11:04 PM      Result Value Ref Range   Color, Urine YELLOW  YELLOW   APPearance CLEAR  CLEAR   Specific Gravity, Urine 1.025  1.005 - 1.030   pH 6.5  5.0 - 8.0   Glucose, UA NEGATIVE  NEGATIVE mg/dL   Hgb urine dipstick NEGATIVE  NEGATIVE   Bilirubin Urine NEGATIVE  NEGATIVE   Ketones, ur 15 (*) NEGATIVE mg/dL   Protein, ur NEGATIVE  NEGATIVE mg/dL   Urobilinogen, UA 0.2  0.0 - 1.0 mg/dL   Nitrite NEGATIVE  NEGATIVE   Leukocytes, UA NEGATIVE  NEGATIVE   Comment: MICROSCOPIC NOT DONE ON URINES WITH NEGATIVE PROTEIN, BLOOD, LEUKOCYTES, NITRITE, OR GLUCOSE <1000 mg/dL.  URINE RAPID DRUG SCREEN (HOSP PERFORMED)     Status: None   Collection Time    11/09/13 11:04 PM      Result Value Ref Range   Opiates NONE DETECTED  NONE DETECTED   Cocaine NONE DETECTED  NONE DETECTED   Benzodiazepines NONE DETECTED  NONE DETECTED   Amphetamines NONE DETECTED  NONE DETECTED   Tetrahydrocannabinol NONE DETECTED  NONE DETECTED   Barbiturates NONE DETECTED  NONE DETECTED   Comment:            DRUG SCREEN FOR MEDICAL PURPOSES     ONLY.  IF CONFIRMATION IS NEEDED     FOR ANY PURPOSE, NOTIFY LAB     WITHIN 5 DAYS.                LOWEST DETECTABLE LIMITS     FOR URINE DRUG SCREEN     Drug Class       Cutoff  (ng/mL)     Amphetamine      1000     Barbiturate      200     Benzodiazepine   938     Tricyclics       101     Opiates          300     Cocaine          300     THC              50   Labs are reviewed and are pertinent for no psychiatric issues.  Current Facility-Administered Medications  Medication Dose Route Frequency Provider Last Rate Last Dose  . acetaminophen (TYLENOL) tablet 650 mg  650 mg Oral Q4H PRN Elwyn Lade, PA-C      . alum & mag hydroxide-simeth (MAALOX/MYLANTA) 200-200-20 MG/5ML suspension 30 mL  30 mL Oral PRN Elwyn Lade, PA-C      . ibuprofen (ADVIL,MOTRIN) tablet 600 mg  600 mg Oral Q8H PRN Elwyn Lade, PA-C   600 mg at 11/09/13 2358  . LORazepam (ATIVAN) tablet 1 mg  1 mg Oral Q8H  PRN Elwyn Lade, PA-C   1 mg at 11/09/13 2358  . nicotine (NICODERM CQ - dosed in mg/24 hours) patch 21 mg  21 mg Transdermal Daily Elwyn Lade, PA-C      . ondansetron [facility]) tablet 4 mg  4 mg Oral Q8H PRN Elwyn Lade, PA-C      . zolpidem (AMBIEN) tablet 5 mg  5 mg Oral QHS PRN Elwyn Lade, PA-C       Current Outpatient Prescriptions  Medication Sig Dispense Refill  . Melatonin-Theanine (MELATONIN FORTE/L-THEANINE) 3-40 MG TABS Take 1 tablet by mouth daily.        Psychiatric Specialty Exam:     Blood pressure 127/86, pulse 113, temperature 98.2 F (36.8 C), temperature source Oral, resp. rate 16, SpO2 100.00%.There is no height or weight on file to calculate BMI.  General Appearance: Well Groomed  Engineer, water::  Good  Speech:  Clear and Coherent  Volume:  Normal  Mood:  Anxious  Affect:  Congruent  Thought Process:  Coherent  Orientation:  Full (Time, Place, and Person)  Thought Content:  Negative  Suicidal Thoughts:  No  Homicidal Thoughts:  No  Memory:  Immediate;   Good Recent;   Good Remote;   Good  Judgement:  Intact  Insight:  Fair  Psychomotor Activity:  Normal  Concentration:  Good  Recall:  Good  Fund of Knowledge:Good   Language: Good  Akathisia:  Negative  Handed:  Right  AIMS (if indicated):     Assets:  Communication Skills Desire for Improvement Financial Resources/Insurance Housing Leisure Time [place] Talents/Skills Transportation Vocational/Educational  Sleep:      Musculoskeletal: Strength & Muscle Tone: within normal limits Gait & Station: normal Patient leans: N/A  Treatment Plan Summary: discharge home with a resource list for anxiety treatment specialists.  Mr Michaux was given prescriptions for Lexapro 10 mg and Adderall XR 20 mg daily.  He did not want to take Zoloft because he was not familiar with it.  Clarene Reamer 11/10/2013 11:54 AM

## 2013-11-10 NOTE — ED Notes (Signed)
Up to the bathroom, ride is here to pick him up and has brought his clothes

## 2013-11-10 NOTE — ED Notes (Signed)
Patient reports feeling cold. Walking quietly in the hallway. Observed stretching and holding his low back. Inquired if patient was still feeling "tight" and offered Tylenol. Patient declined. States he was "cold" like this when he had previously admitted himself in the past. Patient remains anxious, restless. Stated "I know I should sleep, but I feel like I should be doing something".  Encouragement offered.   Safety maintained, Q 15 checks continue.

## 2013-11-10 NOTE — ED Notes (Signed)
Up to the desk on the phone 

## 2013-11-10 NOTE — ED Notes (Signed)
Pt up to the desk to call for ride home

## 2013-11-10 NOTE — ED Notes (Signed)
Verbal permission from pt to give information to his mother Dominga Ferry(Annette Rust).  She is aware that disposition is pending and she will call back later.

## 2013-11-10 NOTE — ED Notes (Signed)
No patient belongings noted.

## 2013-11-10 NOTE — [facility] Suicide Risk Assessment (Signed)
Suicide Risk Assessment  Discharge Assessment     Demographic Factors:  Male and Adolescent or young adult  Total Time spent with patient: 1 hour  Psychiatric Specialty Exam:     Blood pressure 127/86, pulse 113, temperature 98.2 F (36.8 C), temperature source Oral, resp. rate 16, SpO2 100.00%.There is no height or weight on file to calculate BMI.  General Appearance: Well Groomed  Patent attorneyye Contact::  Good  Speech:  Clear and Coherent  Volume:  Normal  Mood:  Anxious  Affect:  Appropriate  Thought Process:  Coherent  Orientation:  Full (Time, Place, and Person)  Thought Content:  Negative  Suicidal Thoughts:  No  Homicidal Thoughts:  No  Memory:  Immediate;   Good Recent;   Good Remote;   Good  Judgement:  Intact  Insight:  Fair  Psychomotor Activity:  Normal  Concentration:  Good  Recall:  Good  Fund of Knowledge:Good  Language: Good  Akathisia:  Negative  Handed:  Right  AIMS (if indicated):     Assets:  Communication Skills Desire for Improvement Financial Resources/Insurance Housing Leisure Time Physical Health Resilience Social Support Talents/Skills Transportation Vocational/Educational  Sleep:       Musculoskeletal: Strength & Muscle Tone: within normal limits Gait & Station: normal Patient leans: N/A   Mental Status Per Nursing Assessment::   On Admission:     Current Mental Status by Physician: NA  Loss Factors: NA  Historical Factors: NA  Risk Reduction Factors:   Sense of responsibility to family, Religious beliefs about death, Employed, Living with another person, especially a relative, Positive social support, Positive therapeutic relationship and Positive coping skills or problem solving skills  Continued Clinical Symptoms:  Severe Anxiety and/or Agitation  Cognitive Features That Contribute To Risk:  none    Suicide Risk:  Minimal: No identifiable suicidal ideation.  Patients presenting with no risk factors but with morbid  ruminations; may be classified as minimal risk based on the severity of the depressive symptoms  Discharge Diagnoses:   AXIS I:  Anxiety Disorder NOS AXIS II:  Deferred AXIS III:   Past Medical History  Diagnosis Date  . Anxiety   . Hypertension    AXIS IV:  problems with anxiety AXIS V:  51-60 moderate symptoms  Plan Of Care/Follow-up recommendations:  Activity:  resume usual activity Diet:  resume diet  Is patient on multiple antipsychotic therapies at discharge:  No   Has Patient had three or more failed trials of antipsychotic monotherapy by history:  No  Recommended Plan for Multiple Antipsychotic Therapies: NA    Benjaman PottGerald D Alixander Rallis 11/10/2013, 12:17 PM

## 2013-11-10 NOTE — ED Notes (Addendum)
Patient alert. To bathroom. Brushing teeth. Feels like he has dry mouth. States he feels like he is going through withdraw. Patient continues with thought blocking, bizarre behavior.  Encouragement offered.   Safety maintained, Q 15 checks continue.

## 2013-11-10 NOTE — ED Notes (Addendum)
Written dc instructions reviewed w/ pt, pt encouraged to follow up w/ OP referals and take medications as directed.  RX x2 given. Pt verbalized understanding.

## 2013-11-10 NOTE — BH Assessment (Signed)
Assessment Note  Derrick Blake is an 28 y.o. male with history of anxiety presents to the ED for evaluation. He was brought in by mother after stating he felt something was wrong. 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 He reports being stressed and nervous due to "tick jolting in my hand" He reported to this writer that he had a nerve or artery jolting. He told ED that He was told by a fellow driver that he had an indentation in his head. Since that time he has been preoccupied that this is "the mark of the beast". His mother reported to ED that he has been having worsening religious delusions and applying scripture from the Bible to his life in illogical ways.He denies SI/ HI and verbally contracts for safety. He denies illegal drug use. He admits to alcohol use and states last use was 11/04/13. He has a history of inpatient admission 2012 at [facility].    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 Anxiety   . Hypertension    Axis IV: problems related to social environment Axis V: 21-30 behavior considerably influenced by delusions or hallucinations OR serious impairment in judgment, communication OR inability to function in almost all areas  Past Medical History:  Past Medical History  Diagnosis Date  . Anxiety   . Hypertension     History reviewed. No pertinent past surgical history.  Family History: History reviewed. No pertinent family history.  Social History:  reports that he has never smoked. He does not have any smokeless tobacco history on file. He reports that he drinks alcohol. He reports that he does not use illicit drugs.  Additional Social History:  Alcohol / Drug Use Pain Medications: none Prescriptions: none Over the Counter: none History of alcohol / drug use?:  Yes Longest period of sobriety (when/how long): weeks Negative Consequences of Use: Personal relationships Withdrawal Symptoms: Other (Comment) (none) Substance #1 Name of Substance 1: alcohol beer 1 - Age of First Use: 19 1 - Amount (size/oz): 1-6 beers 1 - Frequency: 3-4 x weekly 1 - Duration: 8 years 1 - Last Use / Amount: 11/04/13  CIWA: CIWA-Ar BP: 132/85 mmHg Pulse Rate: 72 Nausea and Vomiting: no nausea and no vomiting Tactile Disturbances: none Tremor: no tremor Auditory Disturbances: not present Paroxysmal Sweats: no sweat visible Visual Disturbances: not present Anxiety: three Headache, Fullness in Head: none present Agitation: two Orientation and Clouding of Sensorium: oriented and can do serial additions CIWA-Ar Total: 5 COWS:    Allergies: No Known Allergies  Home Medications:  (Not in a hospital admission)  OB/GYN Status:  No LMP for male patient.  General Assessment Data Location of Assessment: [facility] ED Is this a Tele or Face-to-Face Assessment?: Face-to-Face Is this an Initial Assessment or a Re-assessment for this encounter?: Initial Assessment Living Arrangements: Parent Can pt return to current living arrangement?: Yes Admission Status: Voluntary Is patient capable of signing voluntary admission?: Yes Transfer from: Home Referral Source: Self/Family/Friend  Medical Screening Exam [facility] Walk-in ONLY) Medical Exam completed: Yes  [facility] Plan Living Arrangements: Parent Name of Psychiatrist:  (Dr. Margaretmary Dys in [place]) Name of Therapist:  (none)  Education Status Is patient currently in school?: No Current Grade:  (NA) Highest grade of school patient has  completed:  (NA) Name of school:  (NA) Contact person:  (NA)  Risk to self Suicidal Ideation: No Suicidal Intent: No Is patient at risk for suicide?: No Suicidal Plan?: No Access to Means: No What has been your use of drugs/alcohol within the last 12 months?:  (alcohol) Previous  Attempts/Gestures: No How many times?:  (NA) Other Self Harm Risks:  (No) Triggers for Past Attempts: None known Intentional Self Injurious Behavior: None Family Suicide History: Yes (maternal uncle suicide 8 yrs ago) Recent stressful life event(s): Other (Comment) Persecutory voices/beliefs?:  ("new job very physical and pushes me past my limit") Depression: Yes Depression Symptoms: Tearfulness Substance abuse history and/or treatment for substance abuse?: Yes  Risk to Others Homicidal Ideation: No Thoughts of Harm to Others: No Current Homicidal Intent: No Current Homicidal Plan: No Access to Homicidal Means: No History of harm to others?: No Assessment of Violence: None Noted Does patient have access to weapons?: No Criminal Charges Pending?: No Does patient have a court date: No  Psychosis Hallucinations: None noted Delusions: Unspecified ( keep looking at hand feel artery and nerve jolting)  Mental Status Report Appear/Hygiene: Other (Comment) (appropriate) Eye Contact: Good Motor Activity: Restlessness Speech: Tangential Level of Consciousness: Alert Mood: Anxious;Preoccupied Affect: Anxious;Sad Anxiety Level: Minimal Thought Processes: Tangential Judgement: Other (Comment) (limited) Orientation: Person;Place;Time Obsessive Compulsive Thoughts/Behaviors: Moderate  Cognitive Functioning Concentration: Decreased Memory: Recent Intact;Remote Intact IQ: Average Insight: Fair Impulse Control: Fair Appetite: Fair Weight Loss:  (denies) Weight Gain:  (denies) Sleep: Decreased Total Hours of Sleep:  (4) Vegetative Symptoms: None  ADLScreening [facility] Assessment Services) Patient's cognitive ability adequate to safely complete daily activities?: Yes Patient able to express need for assistance with ADLs?: Yes Independently performs ADLs?: Yes (appropriate for developmental age)  Prior Inpatient Therapy Prior Inpatient Therapy: Yes Prior Therapy Dates:   (2012) [facility] Reason for Treatment:  (Anxiety, Drug use)  Prior Outpatient Therapy Prior Outpatient Therapy: Yes Prior Therapy Dates:  (2014) Prior Therapy Facilty/Provider(s):  Air traffic controller(Private Provider) Reason for Treatment:  (mood swings, depression, stressed)  ADL Screening (condition at time of admission) Patient's cognitive ability adequate to safely complete daily activities?: Yes Is the patient deaf or have difficulty hearing?: No Does the patient have difficulty seeing, even when wearing glasses/contacts?: No Does the patient have difficulty concentrating, remembering, or making decisions?: Yes Patient able to express need for assistance with ADLs?: Yes Does the patient have difficulty dressing or bathing?: No Independently performs ADLs?: Yes (appropriate for developmental age) Does the patient have difficulty walking or climbing stairs?: No Weakness of Legs: None Weakness of Arms/Hands: None  Home Assistive Devices/Equipment Home Assistive Devices/Equipment: None    Abuse/Neglect Assessment (Assessment to be complete while patient is alone) Physical Abuse: Denies (Pt denies then proceed to talk of wrestling with a friend as a child who kept him pinned down for a long time.) Verbal Abuse: Denies Sexual Abuse: Denies Exploitation of patient/patient's resources: Denies Self-Neglect: Denies Values / Beliefs Cultural Requests During Hospitalization: None Spiritual Requests During Hospitalization: None Consults Spiritual Care Consult Needed: No Social Work Consult Needed: No Merchant navy officerAdvance Directives (For Healthcare) Advance Directive: Patient does not have advance directive;Patient would not like information Pre-existing out of facility DNR order (yellow form or pink MOST form): No    Additional Information 1:1 In Past 12 Months?: No CIRT Risk: No Elopement Risk: No Does patient have medical clearance?: Yes     Disposition:   Disposition Initial Assessment Completed for this Encounter: Yes Disposition of  Patient: Inpatient treatment program  On Site Evaluation by:   Reviewed with Physician:    Celene Kras 11/10/2013 4:21 AM

## 2013-11-10 NOTE — ED Notes (Signed)
CSW into talk w/ patient

## 2013-11-10 NOTE — Consult Note (Signed)
  Review of Systems  Constitutional: Negative.   HENT: Negative.   Eyes: Negative.   Respiratory: Negative.   Cardiovascular: Negative.   Gastrointestinal: Negative.   Genitourinary: Negative.   Musculoskeletal: Negative.   Skin: Negative.   Endo/Heme/Allergies: Negative.   Psychiatric/Behavioral: The patient is nervous/anxious.

## 2013-11-10 NOTE — ED Notes (Signed)
shuvon NP and Dr Marguarite Arbouralyor into see

## 2013-11-10 NOTE — ED Notes (Signed)
Patient is emotional. Having trouble focusing. Reports feelings of paranoia, anxiety, and depression. Feels that "My mind and body are strained". Has racing thoughts and thought blocking. 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 States he usually keeps a regular routine and on the trip his sleep and diet were altered and he was overwhelmed by the pace of the work and culture shock. Patient feels he let the job and himself down. Feels regret. Is unable to quantify feelings of anxiety and depression. Also reports that he stopped all intake of caffeine and feels this has increased depression lately. Denies SI, HI, AVH. Feels "racing thoughts" are from him. States he had a Librarian, academicChristian counselor in the past and has had panic attacks with most recent on the 6 day trip. Believes he returned from trip Tuesday. Is worried that he lost his phone and has no way to let his friends no where he is.  Encouragement offered. Environment adjusted. Given Motrin and Ativan.

## 2013-11-10 NOTE — ED Notes (Signed)
Pt ambulatory to dc window w/ mHt, belongings given to pt after leaving the unit

## 2013-11-10 NOTE — ED Notes (Signed)
Pt is concerned about taking zoloft and is requesting to be put back on lexapro.  Dr Ladona Ridgelaylor aware Zoloft dc'd and Rx for lexapro written

## 2013-11-10 NOTE — ED Notes (Signed)
Dr Atlee Abideaylor nad shuvon NP in w/ pt

## 2013-11-10 NOTE — ED Notes (Signed)
Dr Ladona Ridgeltaylor into see

## 2013-11-10 NOTE — ED Notes (Signed)
Patient ate a few crackers and peanut butter. Feels appetite and gi may be "damaged" from stressful events on his work trip. Continues to ruminate about the job and the trip and its impact on him. Patient stated he felt "paranoid" about taking the Ativan and water. Worried about its effect. Patient reports feeling very anxious. Continues to get out of bed and walk hall quietly.   Encouragement offered. Ativan taken. Environment adjusted.  Patient safety maintained, Q 15 checks continue.
